# Patient Record
Sex: Female | Born: 1980
Health system: Southern US, Community
[De-identification: ages and names within clinical notes are randomized; demographics above are authoritative.]

---

## 2020-07-08 ENCOUNTER — Other Ambulatory Visit (HOSPITAL_COMMUNITY)
Admission: RE | Admit: 2020-07-08 | Discharge: 2020-07-08 | Disposition: A | Discharge status: Home / Self Care | Payer: BC Managed Care – PPO | Source: Ambulatory Visit | Attending: Family Medicine | Admitting: Family Medicine

## 2020-07-08 ENCOUNTER — Other Ambulatory Visit: Payer: Self-pay | Admitting: Family Medicine

## 2020-07-08 DIAGNOSIS — Z124 Encounter for screening for malignant neoplasm of cervix: Secondary | ICD-10-CM | POA: Diagnosis present

## 2020-07-09 ENCOUNTER — Other Ambulatory Visit: Payer: Self-pay | Admitting: Family Medicine

## 2020-07-09 DIAGNOSIS — Z1231 Encounter for screening mammogram for malignant neoplasm of breast: Secondary | ICD-10-CM

## 2020-07-14 LAB — CYTOLOGY - PAP
Amendment: NEGATIVE
Comment: NEGATIVE
Comment: NEGATIVE
Diagnosis: NEGATIVE
Diagnosis: NEGATIVE
High risk HPV: NEGATIVE
High risk HPV: NEGATIVE

## 2020-07-28 ENCOUNTER — Other Ambulatory Visit: Payer: Self-pay

## 2020-07-28 ENCOUNTER — Ambulatory Visit
Admission: RE | Admit: 2020-07-28 | Discharge: 2020-07-28 | Disposition: A | Discharge status: Home / Self Care | Payer: BC Managed Care – PPO | Source: Ambulatory Visit | Attending: Family Medicine | Admitting: Family Medicine

## 2020-07-28 DIAGNOSIS — Z1231 Encounter for screening mammogram for malignant neoplasm of breast: Secondary | ICD-10-CM

## 2020-11-20 ENCOUNTER — Ambulatory Visit: Payer: BC Managed Care – PPO | Attending: Family

## 2020-11-20 DIAGNOSIS — Z23 Encounter for immunization: Secondary | ICD-10-CM

## 2020-11-20 NOTE — Progress Notes (Signed)
   Covid-19 Vaccination Clinic  Name:  [facility]    MRN: [id number] DOB: 03-19-1980  11/20/2020  Ms. Gazzola was observed post Covid-19 immunization for 15 minutes without incident. She was provided with Vaccine Information Sheet and instruction to access the V-Safe system.   Ms. Pardi was instructed to call 911 with any severe reactions post vaccine: Difficulty breathing  Swelling of face and throat  A fast heartbeat  A bad rash all over body  Dizziness and weakness   Immunizations Administered     Name Date Dose VIS Date Route   Moderna Covid-19 vaccine Bivalent Booster 11/20/2020 10:27 AM 0.5 mL 08/22/2020 Intramuscular   Manufacturer: Moderna   Lot: [id number]   NDC: 82518-984-21

## 2022-03-22 IMAGING — MG MM DIGITAL SCREENING BILAT W/ TOMO AND CAD
8 series · 8 of 24 positions shown · non-contrast
Comparison: Previous exams 03/12/2019 and 02/03/2017 from
[REDACTED] in [place], [HOSPITAL].

CLINICAL DATA: Screening.

EXAM:
DIGITAL SCREENING BILATERAL MAMMOGRAM WITH TOMOSYNTHESIS AND CAD
TECHNIQUE: Bilateral screening digital craniocaudal and mediolateral oblique
mammograms were obtained. Bilateral screening digital breast
tomosynthesis was performed. The images were evaluated with
computer-aided detection.

[R MLO synth-2D]
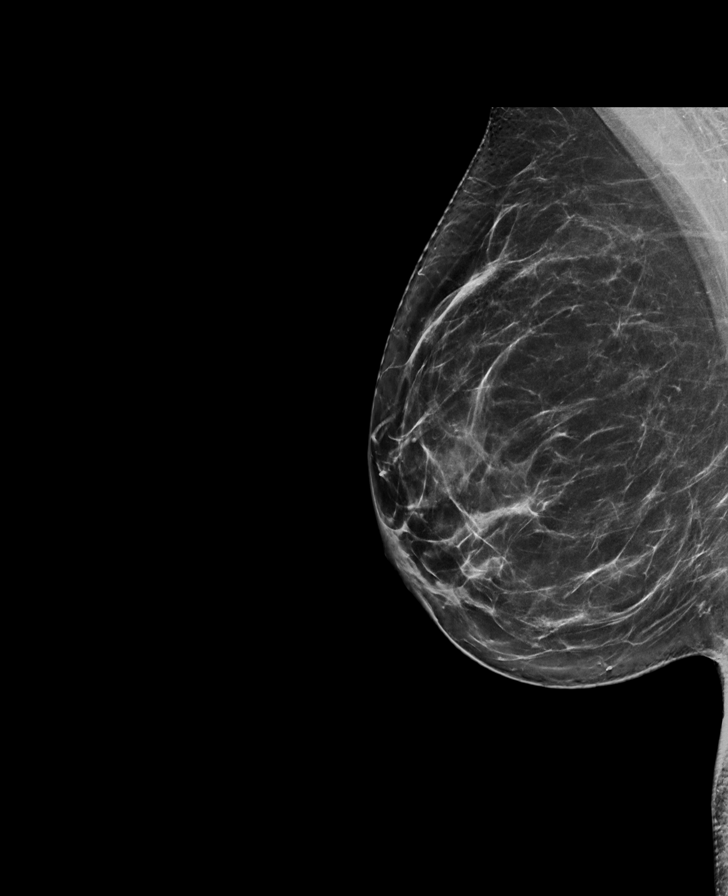

[L MLO synth-2D]
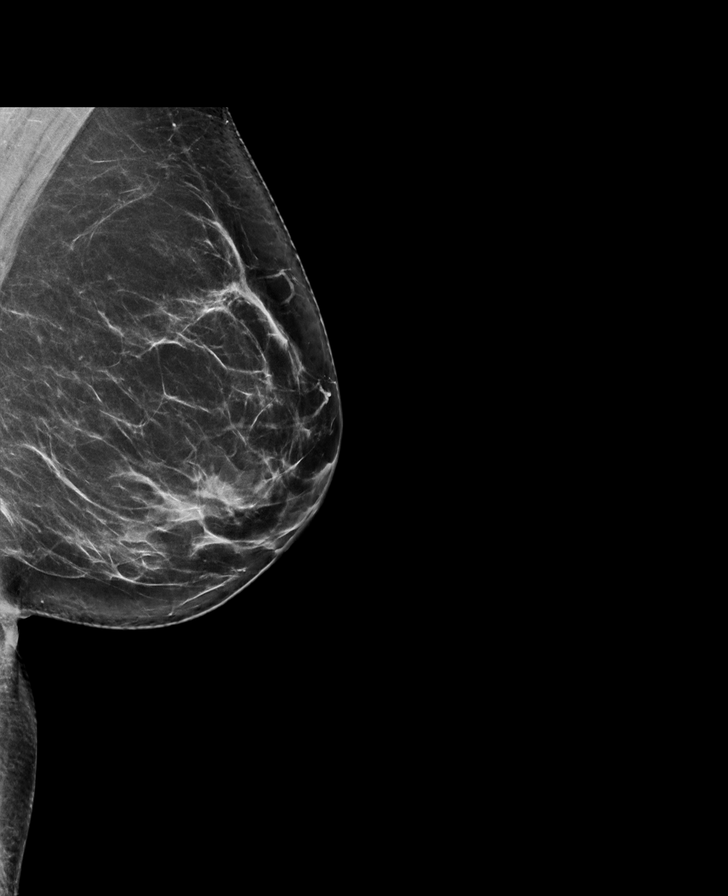

[L CC synth-2D]
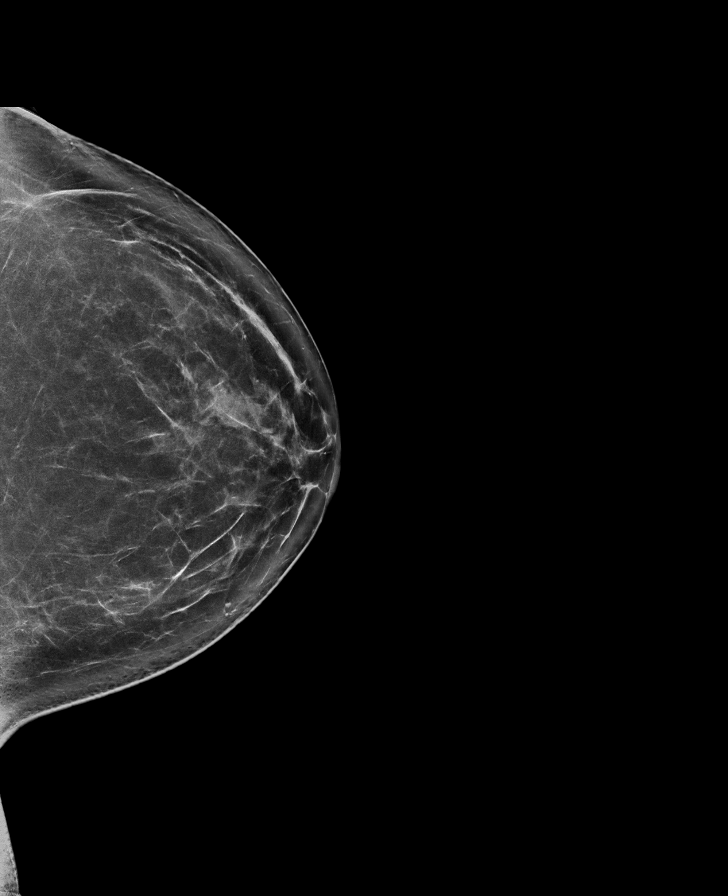

[R CC synth-2D]
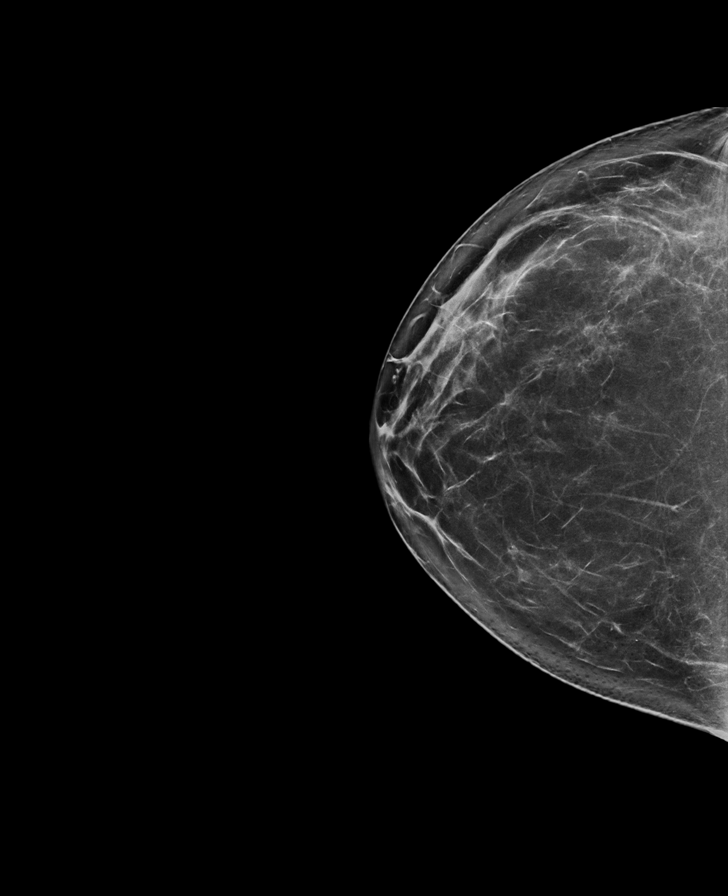

[L CC tomo · tomo slice 43/86.0]
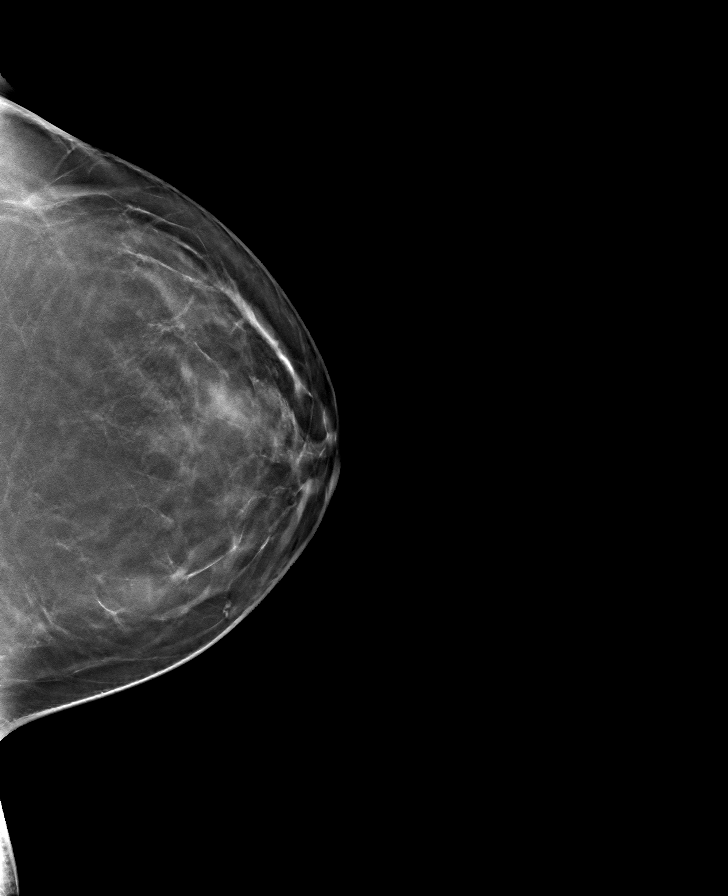

[L MLO tomo · tomo slice 43/84.0]
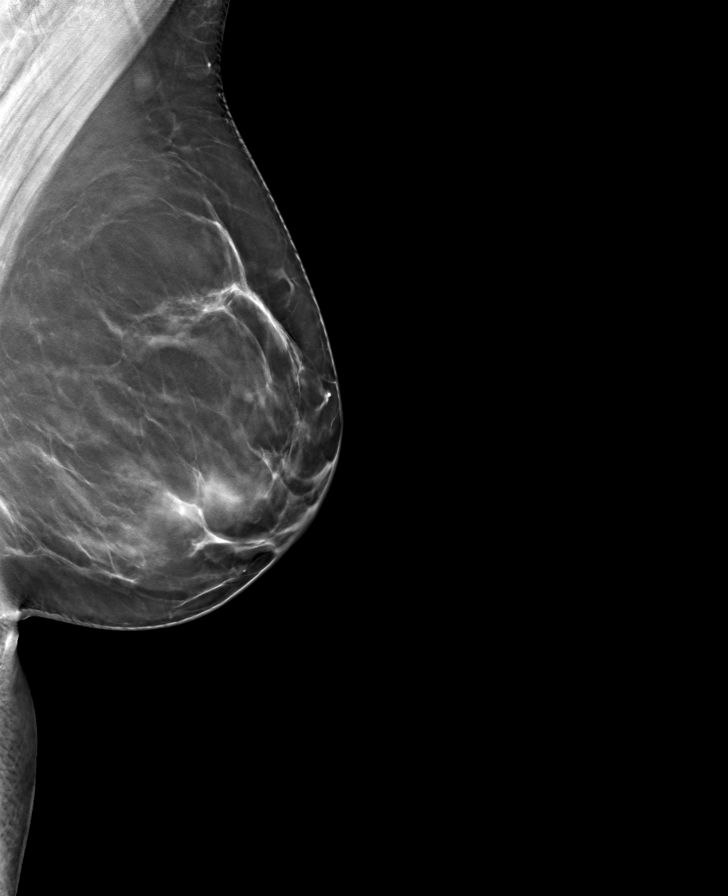

[R CC tomo · tomo slice 41/82.0]
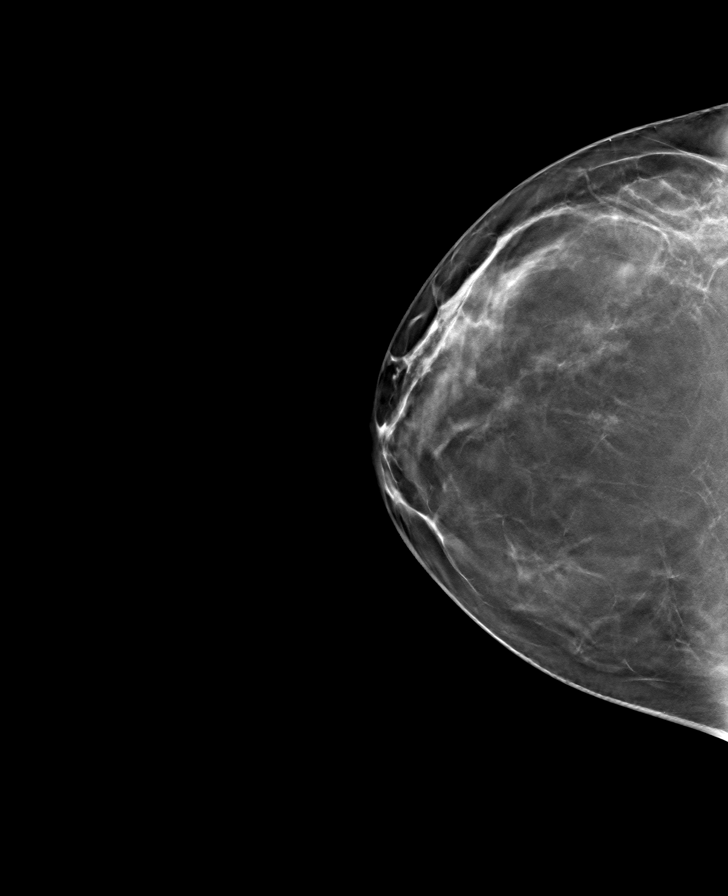

[R MLO tomo · tomo slice 43/85.0]
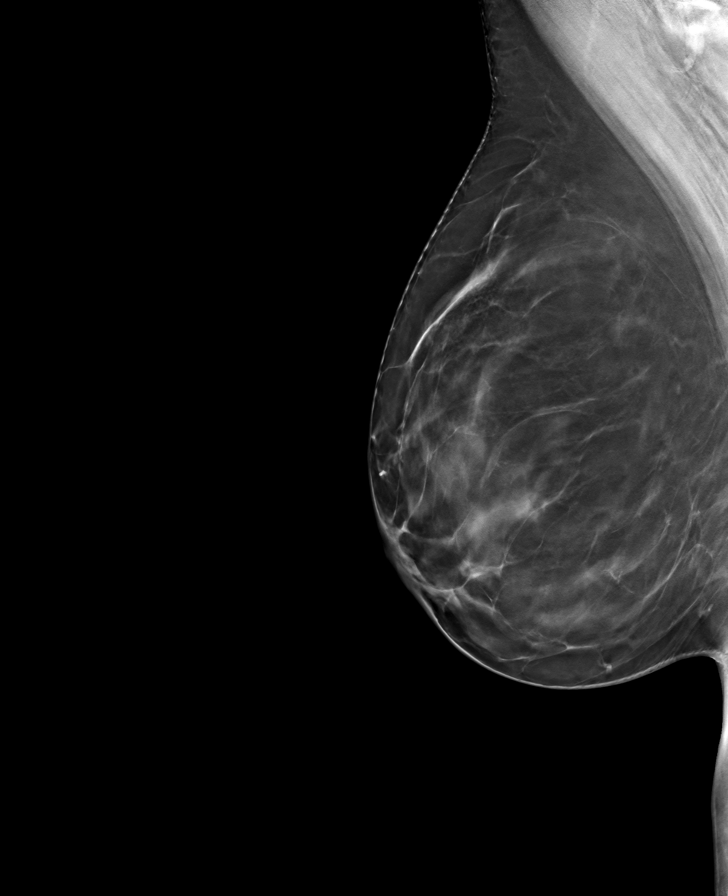

[8 of 24 positions shown; findings below may reference images not displayed]

Awaiting the prior
outside mammograms accounts for the delay in this report.

ACR Breast Density Category b: There are scattered areas of
fibroglandular density.
FINDINGS: There are no findings suspicious for malignancy.
IMPRESSION: No mammographic evidence of malignancy. A result letter of this
screening mammogram will be mailed directly to the patient.

RECOMMENDATION:
Screening mammogram in one year. (Code:[id number])

BI-RADS CATEGORY  1: Negative.
